# Patient Record
Sex: Female | Born: 1990 | Hispanic: Yes | Marital: Married
Health system: Southern US, Community
[De-identification: ages and names within clinical notes are randomized; demographics above are authoritative.]

---

## 2013-11-15 HISTORY — PX: OOPHORECTOMY: SHX86

## 2017-03-11 ENCOUNTER — Encounter (HOSPITAL_COMMUNITY): Payer: Self-pay | Admitting: Emergency Medicine

## 2017-03-11 ENCOUNTER — Emergency Department (HOSPITAL_COMMUNITY): Payer: Medicaid - Out of State

## 2017-03-11 ENCOUNTER — Emergency Department (HOSPITAL_COMMUNITY)
Admission: EM | Admit: 2017-03-11 | Discharge: 2017-03-11 | Disposition: A | Discharge status: Home / Self Care | Payer: Medicaid - Out of State | Attending: Emergency Medicine | Admitting: Emergency Medicine

## 2017-03-11 DIAGNOSIS — R109 Unspecified abdominal pain: Secondary | ICD-10-CM

## 2017-03-11 DIAGNOSIS — R319 Hematuria, unspecified: Secondary | ICD-10-CM | POA: Insufficient documentation

## 2017-03-11 LAB — CBC WITH DIFFERENTIAL/PLATELET
BASOS ABS: 0 10*3/uL (ref 0.0–0.1)
Basophils Relative: 0 %
EOS PCT: 2 %
Eosinophils Absolute: 0.2 10*3/uL (ref 0.0–0.7)
HEMATOCRIT: 37.7 % (ref 36.0–46.0)
Hemoglobin: 12.5 g/dL (ref 12.0–15.0)
LYMPHS ABS: 2.6 10*3/uL (ref 0.7–4.0)
LYMPHS PCT: 25 %
MCH: 30.9 pg (ref 26.0–34.0)
MCHC: 33.2 g/dL (ref 30.0–36.0)
MCV: 93.1 fL (ref 78.0–100.0)
Monocytes Absolute: 0.7 10*3/uL (ref 0.1–1.0)
Monocytes Relative: 7 %
NEUTROS PCT: 66 %
Neutro Abs: 7 10*3/uL (ref 1.7–7.7)
PLATELETS: 181 10*3/uL (ref 150–400)
RBC: 4.05 MIL/uL (ref 3.87–5.11)
RDW: 13.1 % (ref 11.5–15.5)
WBC: 10.5 10*3/uL (ref 4.0–10.5)

## 2017-03-11 LAB — COMPREHENSIVE METABOLIC PANEL
ALT: 12 U/L — ABNORMAL LOW (ref 14–54)
AST: 16 U/L (ref 15–41)
Albumin: 3.7 g/dL (ref 3.5–5.0)
Alkaline Phosphatase: 58 U/L (ref 38–126)
Anion gap: 8 (ref 5–15)
BUN: 10 mg/dL (ref 6–20)
CHLORIDE: 105 mmol/L (ref 101–111)
CO2: 24 mmol/L (ref 22–32)
Calcium: 8.8 mg/dL — ABNORMAL LOW (ref 8.9–10.3)
Creatinine, Ser: 0.52 mg/dL (ref 0.44–1.00)
GFR calc Af Amer: >60 mL/min (ref >60)
Glucose, Bld: 96 mg/dL (ref 65–99)
POTASSIUM: 3.8 mmol/L (ref 3.5–5.1)
SODIUM: 137 mmol/L (ref 135–145)
Total Bilirubin: 0.3 mg/dL (ref 0.3–1.2)
Total Protein: 6.6 g/dL (ref 6.5–8.1)

## 2017-03-11 LAB — URINALYSIS, ROUTINE W REFLEX MICROSCOPIC
BILIRUBIN URINE: NEGATIVE
Glucose, UA: NEGATIVE mg/dL
KETONES UR: NEGATIVE mg/dL
LEUKOCYTES UA: NEGATIVE
Nitrite: NEGATIVE
Protein, ur: NEGATIVE mg/dL
SPECIFIC GRAVITY, URINE: 1.01 (ref 1.005–1.030)
pH: 5 (ref 5.0–8.0)

## 2017-03-11 LAB — I-STAT BETA HCG BLOOD, ED (MC, WL, AP ONLY)

## 2017-03-11 MED ORDER — MORPHINE SULFATE (PF) 4 MG/ML IV SOLN
4.0000 mg | Freq: Once | INTRAVENOUS | Status: AC
Start: 2017-03-11 — End: 2017-03-11
  Administered 2017-03-11: 4 mg via INTRAVENOUS
  Filled 2017-03-11: qty 1

## 2017-03-11 MED ORDER — HYDROCODONE-ACETAMINOPHEN 5-325 MG PO TABS: 1.0000 | ORAL_TABLET | Freq: Four times a day (QID) | ORAL | 0 refills | Status: AC | PRN

## 2017-03-11 MED ORDER — KETOROLAC TROMETHAMINE 30 MG/ML IJ SOLN
15.0000 mg | Freq: Once | INTRAMUSCULAR | Status: AC
  Administered 2017-03-11: 15 mg via INTRAVENOUS
  Filled 2017-03-11: qty 1

## 2017-03-11 MED ORDER — SODIUM CHLORIDE 0.9 % IV BOLUS (SEPSIS)
1000.0000 mL | Freq: Once | INTRAVENOUS | Status: AC
  Administered 2017-03-11: 1000 mL via INTRAVENOUS

## 2017-03-11 MED ORDER — ONDANSETRON HCL 4 MG/2ML IJ SOLN
4.0000 mg | Freq: Once | INTRAMUSCULAR | Status: DC
  Filled 2017-03-11: qty 2

## 2017-03-11 MED ORDER — IBUPROFEN 400 MG PO TABS
400.0000 mg | ORAL_TABLET | Freq: Three times a day (TID) | ORAL | 0 refills | Status: AC
Start: 2017-03-11 — End: 2017-03-14

## 2017-03-11 NOTE — Discharge Instructions (Signed)
As discussed, today's evaluation is largely reassuring. Your flank pain and hematuria are likely due to a kidney stone. It is important to take all medication as directed, monitor your condition carefully, and follow-up with our urology colleagues.  Return here for concerning changes in your condition.

## 2017-03-11 NOTE — ED Provider Notes (Addendum)
[facility]-EMERGENCY DEPT Provider Note   CSN: [id number] Arrival date & time: 03/11/17  1324     History   Chief Complaint No chief complaint on file.   HPI Rachael Trujillo is a 26 y.o. female.  HPI  Patient presents with concern of left flank pain. Pain began seemingly several days ago, has become worse over the past day. Pain is sore, moderate, radiating towards the lower abdomen. No dysuria, no hematuria. There is some nausea, no vomiting, no diarrhea. Patient has a notable history of prior oophorectomy, stating that she had a mass, and had ovary, tube removed. She is unsure of which side. During this current illness, the patient has had no fever, confusion, disorientation. No clear alleviating or exacerbating factors. She is here with her husband.   History reviewed. No pertinent past medical history.  There are no active problems to display for this patient.   Past Surgical History:  Procedure Laterality Date  . OOPHORECTOMY  2015   patient is unsure if right or left    OB History    No data available       Home Medications    Prior to Admission medications   Not on File    Family History No family history on file.  Social History Social History  Substance Use Topics  . Smoking status: Not on file  . Smokeless tobacco: Not on file  . Alcohol use Not on file     Allergies   Patient has no known allergies.   Review of Systems Review of Systems  Constitutional:       Per HPI, otherwise negative  HENT:       Per HPI, otherwise negative  Respiratory:       Per HPI, otherwise negative  Cardiovascular:       Per HPI, otherwise negative  Gastrointestinal: Negative for vomiting.  Endocrine:       Negative aside from HPI  Genitourinary:       Neg aside from HPI   Musculoskeletal:       Per HPI, otherwise negative  Skin: Negative.   Neurological: Negative for syncope.     Physical Exam Updated Vital Signs BP 120/76   Pulse 72   Temp  98.7 F (37.1 C) (Oral)   Resp 18   LMP  (Within Weeks)   SpO2 100%   Physical Exam  Constitutional: She is oriented to person, place, and time. She appears well-developed and well-nourished. No distress.  HENT:  Head: Normocephalic and atraumatic.  Eyes: Conjunctivae and EOM are normal.  Cardiovascular: Normal rate and regular rhythm.   Pulmonary/Chest: Effort normal and breath sounds normal. No stridor. No respiratory distress.  Abdominal: She exhibits no distension.  Musculoskeletal: She exhibits no edema.  Neurological: She is alert and oriented to person, place, and time. No cranial nerve deficit.  Skin: Skin is warm and dry.  Psychiatric: She has a normal mood and affect.  Nursing note and vitals reviewed.    ED Treatments / Results  Labs (all labs ordered are listed, but only abnormal results are displayed) Labs Reviewed  COMPREHENSIVE METABOLIC PANEL - Abnormal; Notable for the following:       Result Value   Calcium 8.8 (*)    ALT 12 (*)    All other components within normal limits  URINALYSIS, ROUTINE W REFLEX MICROSCOPIC - Abnormal; Notable for the following:    Hgb urine dipstick MODERATE (*)    Bacteria, UA FEW (*)  Squamous Epithelial / LPF 0-5 (*)    All other components within normal limits  CBC WITH DIFFERENTIAL/PLATELET  I-STAT BETA HCG BLOOD, ED ([facility], [facility], [facility] ONLY)    Radiology Ct Renal Stone Study  Result Date: 03/11/2017 CLINICAL DATA:  RIGHT flank pain with nausea. EXAM: CT ABDOMEN AND PELVIS WITHOUT CONTRAST TECHNIQUE: Multidetector CT imaging of the abdomen and pelvis was performed following the standard protocol without IV contrast. COMPARISON:  None FINDINGS: Lower chest: Lung bases are clear. Hepatobiliary: No focal hepatic lesion. No biliary duct dilatation. Gallbladder is normal. Common bile duct is normal. Pancreas: Pancreas is normal. No ductal dilatation. No pancreatic inflammation. Spleen: Normal spleen Adrenals/urinary tract: Adrenal glands  normal. No nephrolithiasis or ureterolithiasis. No obstructive uropathy. No bladder calculi. Stomach/Bowel: Stomach, small bowel, appendix, and cecum are normal. The colon and rectosigmoid colon are normal. Vascular/Lymphatic: Abdominal aorta is normal caliber. There is no retroperitoneal or periportal lymphadenopathy. No pelvic lymphadenopathy. Reproductive: Uterus and ovaries normal. Other: No free fluid. Musculoskeletal: No aggressive osseous lesion.  Mild scoliosis. IMPRESSION: 1. No ureterolithiasis or nephrolithiasis 2. Normal appendix. 3. No CT evidence of abdominopelvic inflammation.  Mild scoliosis Electronically Signed   By: Genevive Bi M.D.   On: 03/11/2017 10:40    Procedures Procedures (including critical care time)  Medications Ordered in ED Medications  ondansetron (ZOFRAN) injection 4 mg (not administered)  morphine 4 MG/ML injection 4 mg (not administered)  sodium chloride 0.9 % bolus 1,000 mL (1,000 mLs Intravenous New Bag/Given 03/11/17 0835)  ketorolac (TORADOL) 30 MG/ML injection 15 mg (15 mg Intravenous Given 03/11/17 0835)     Initial Impression / Assessment and Plan / ED Course  I have reviewed the triage vital signs and the nursing notes.  Pertinent labs & imaging results that were available during my care of the patient were reviewed by me and considered in my medical decision making (see chart for details).  12:09 PM Patient states that she feels better following morphine, Toradol, fluids. Patient and husband aware of all findings, including reassuring CT scan. With no alarming findings on CT scan, in the description of flank pain, hematuria, there suspicion for passed kidney stone, and the patient on repeat exam notes that she does have a history of this in the past. With reassuring other findings, no evidence for peritonitis, bacteremia, sepsis, reassuring CT scan, patient will follow-up as an outpatient.   Final Clinical Impressions(s) / ED Diagnoses    Final diagnoses:  Flank pain  Hematuria, unspecified type    New Prescriptions New Prescriptions   HYDROCODONE-ACETAMINOPHEN (NORCO/VICODIN) 5-325 MG TABLET    Take 1 tablet by mouth every 6 (six) hours as needed for severe pain.   IBUPROFEN (ADVIL,MOTRIN) 400 MG TABLET    Take 1 tablet (400 mg total) by mouth 3 (three) times daily. Take one tablet three times daily for three days     Gerhard Munch, MD 03/11/17 1210    Gerhard Munch, MD 03/29/17 2151

## 2017-03-11 NOTE — ED Triage Notes (Signed)
Patient complains of left flank pain, states she had an ovary (patient is unsure of laterality) removed in 2015 and the pain is similar now as to the pain she was having prior to the oophorectomy.  Patient is alert and oriented at this time and in no apparent distress.

## 2017-03-11 NOTE — ED Notes (Signed)
Patient complains of pain approximately 1-2 inches proximal to the IV insertion site.  Infusion rate decreased, pain subsided.  No discoloration or edema at site of pain.

## 2018-05-08 IMAGING — CT CT RENAL STONE PROTOCOL
2 of 5 series · 16 of 46 positions shown, 18 images · non-contrast
Comparison: None

CLINICAL DATA: RIGHT flank pain with nausea.

EXAM:
CT ABDOMEN AND PELVIS WITHOUT CONTRAST
TECHNIQUE: Multidetector CT imaging of the abdomen and pelvis was performed
following the standard protocol without IV contrast.

[Series 2: abdroutine 5.0 i31s 1 · axial · 0.81mm/px · z∈[-475,-70]mm · 13 of 91 slices shown, 15 images]
[im 5/91  soft-tissue]
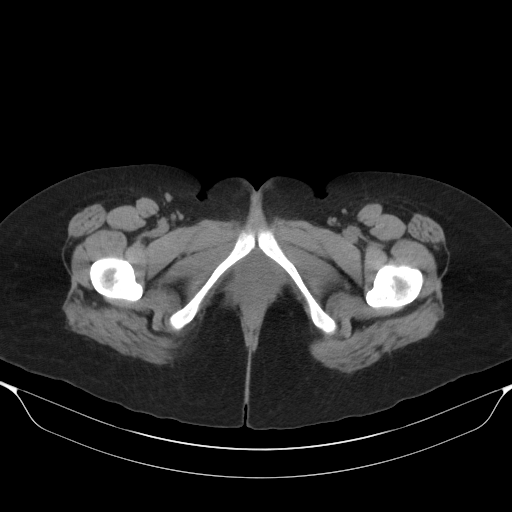
[im 5/91  bone]
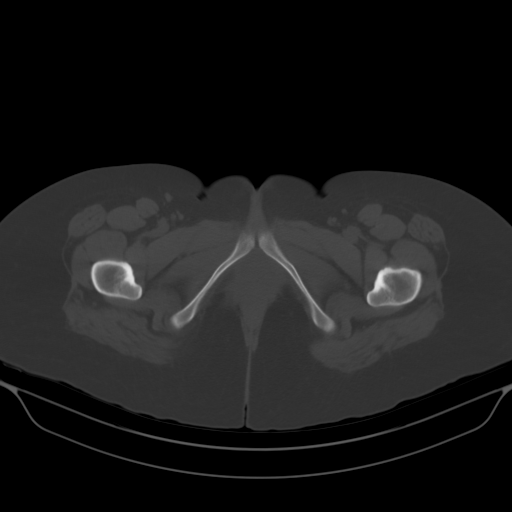
[im 13/91  soft-tissue]
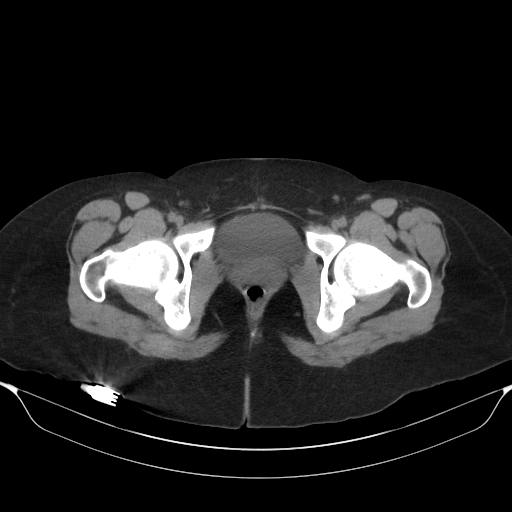
[im 18/91  soft-tissue]
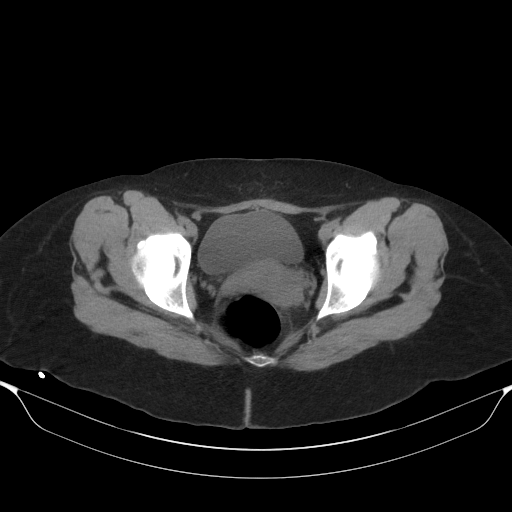
[im 26/91  soft-tissue]
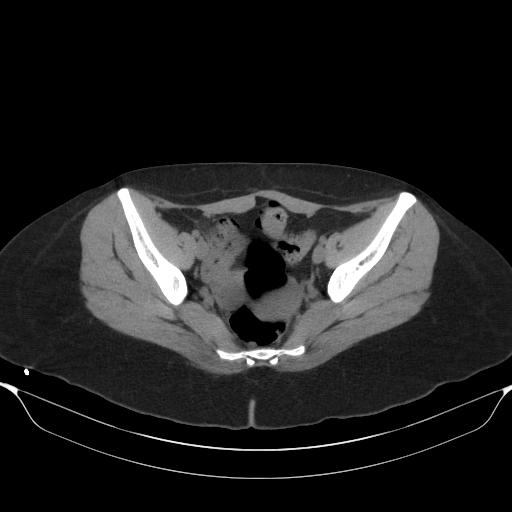
[im 31/91  soft-tissue]
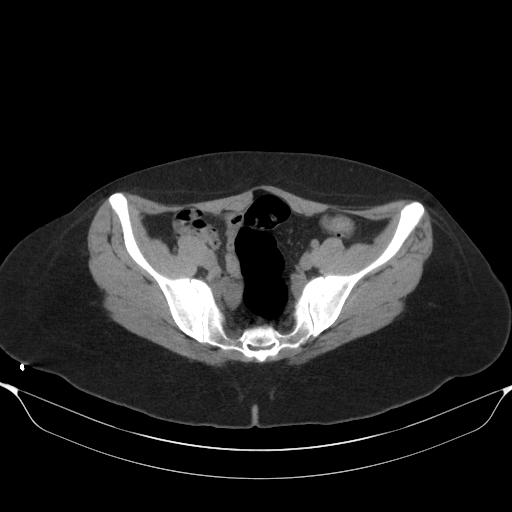
[im 39/91  soft-tissue]
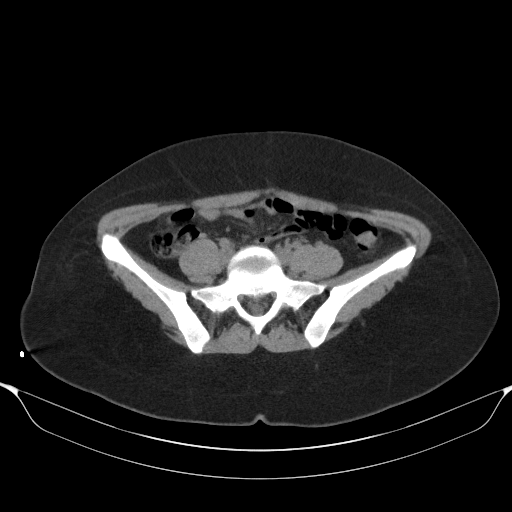
[im 48/91  soft-tissue]
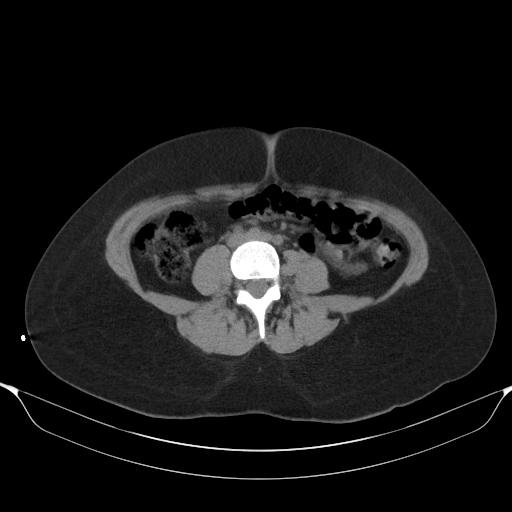
[im 52/91  soft-tissue]
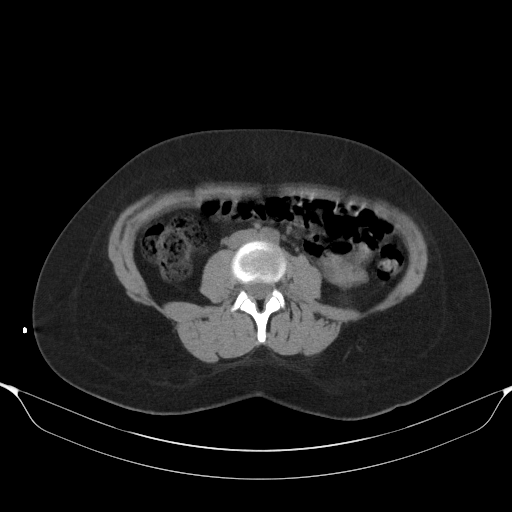
[im 61/91  soft-tissue]
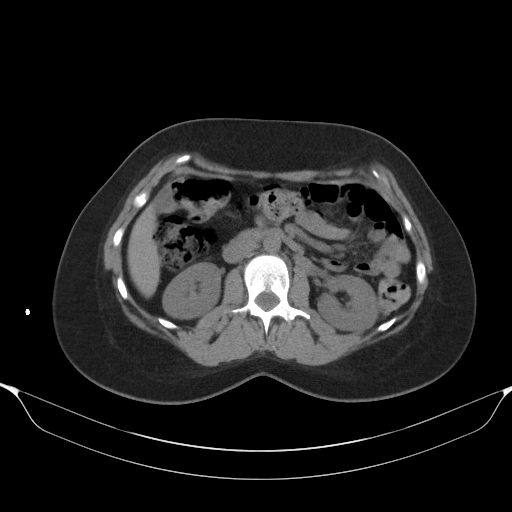
[im 61/91  bone]
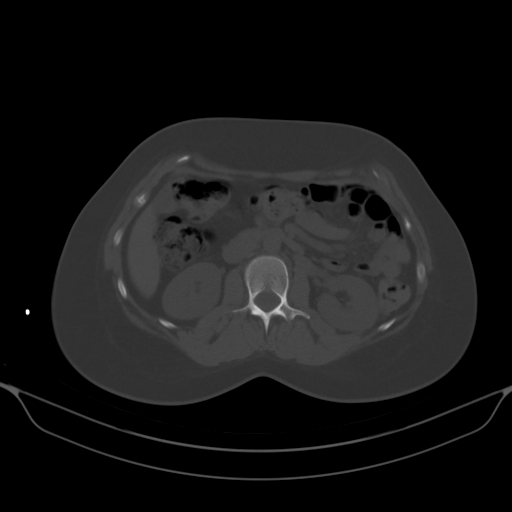
[im 65/91  soft-tissue]
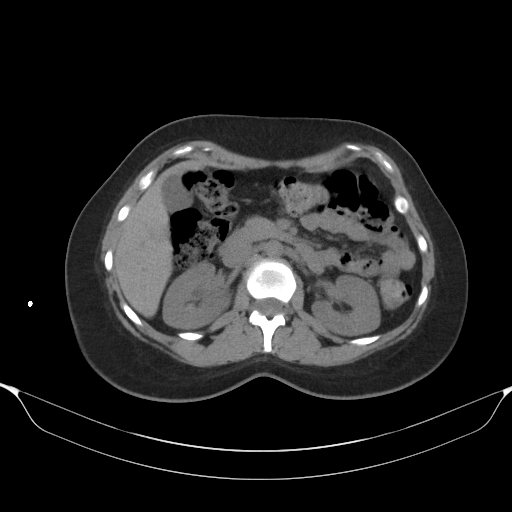
[im 73/91  soft-tissue]
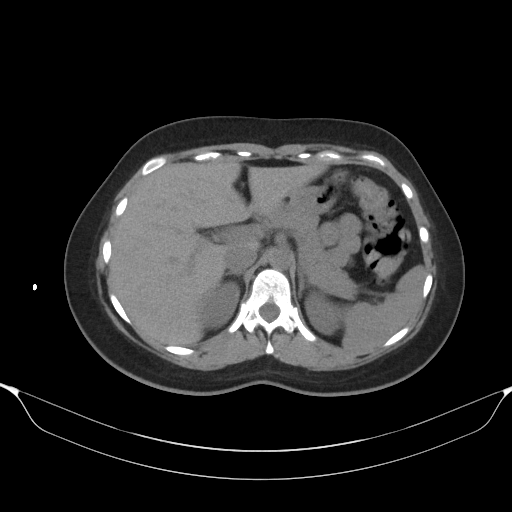
[im 78/91  soft-tissue]
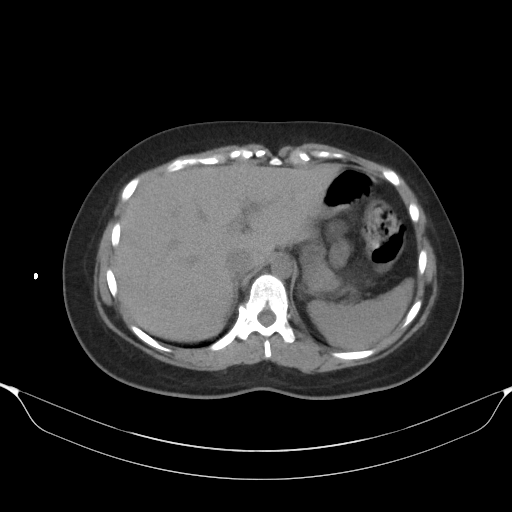
[im 86/91  soft-tissue]
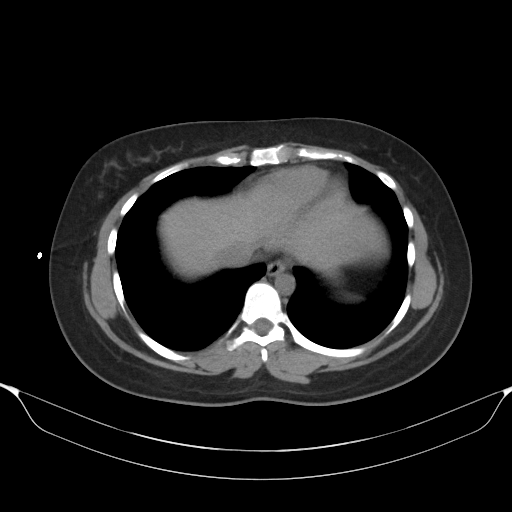

[Series 5: abdroutine 2.0 mpr cor · coronal · 0.80mm/px · 3 of 135 slices shown]
[im 45/135  soft-tissue]
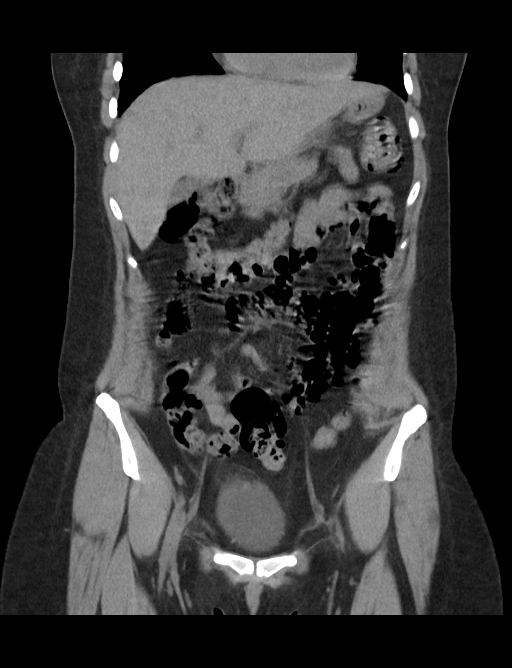
[im 60/135  soft-tissue]
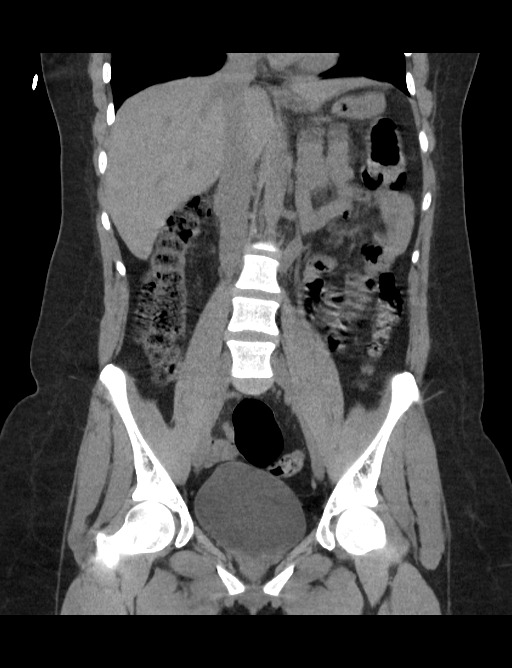
[im 75/135  soft-tissue]
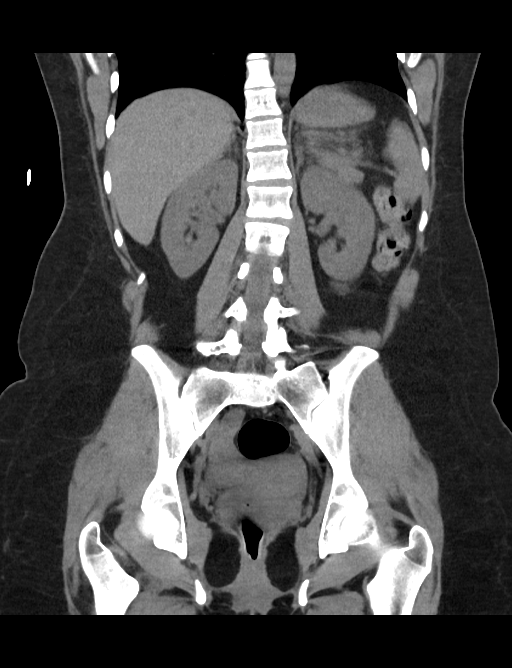

[16 of 46 positions shown; findings below may reference images not displayed]

FINDINGS: Lower chest: Lung bases are clear.

Hepatobiliary: No focal hepatic lesion. No biliary duct dilatation.
Gallbladder is normal. Common bile duct is normal.

Pancreas: Pancreas is normal. No ductal dilatation. No pancreatic
inflammation.

Spleen: Normal spleen

Adrenals/urinary tract: Adrenal glands normal. No nephrolithiasis or
ureterolithiasis. No obstructive uropathy. No bladder calculi.

Stomach/Bowel: Stomach, small bowel, appendix, and cecum are normal.
The colon and rectosigmoid colon are normal.

Vascular/Lymphatic: Abdominal aorta is normal caliber. There is no
retroperitoneal or periportal lymphadenopathy. No pelvic
lymphadenopathy.

Reproductive: Uterus and ovaries normal.

Other: No free fluid.

Musculoskeletal: No aggressive osseous lesion.  Mild scoliosis.
IMPRESSION: 1. No ureterolithiasis or nephrolithiasis
2. Normal appendix.
3. No CT evidence of abdominopelvic inflammation.  Mild scoliosis
